# Patient Record
Sex: Male | Born: 1990 | Race: White | Hispanic: No | Marital: Single | Smoking: Never smoker
Health system: Southern US, Community
[De-identification: ages and names within clinical notes are randomized; demographics above are authoritative.]

---

## 2017-05-19 ENCOUNTER — Encounter: Payer: Self-pay | Admitting: *Deleted

## 2017-05-19 ENCOUNTER — Emergency Department
Admission: EM | Admit: 2017-05-19 | Discharge: 2017-05-19 | Disposition: A | Discharge status: Home / Self Care | Payer: No Typology Code available for payment source | Attending: Emergency Medicine | Admitting: Emergency Medicine

## 2017-05-19 ENCOUNTER — Emergency Department: Payer: No Typology Code available for payment source

## 2017-05-19 DIAGNOSIS — S060X1A Concussion with loss of consciousness of 30 minutes or less, initial encounter: Secondary | ICD-10-CM | POA: Diagnosis not present

## 2017-05-19 DIAGNOSIS — S0990XA Unspecified injury of head, initial encounter: Secondary | ICD-10-CM | POA: Diagnosis present

## 2017-05-19 DIAGNOSIS — Y939 Activity, unspecified: Secondary | ICD-10-CM | POA: Diagnosis not present

## 2017-05-19 DIAGNOSIS — Y92488 Other paved roadways as the place of occurrence of the external cause: Secondary | ICD-10-CM | POA: Insufficient documentation

## 2017-05-19 DIAGNOSIS — Y999 Unspecified external cause status: Secondary | ICD-10-CM | POA: Insufficient documentation
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
## 2017-05-19 NOTE — Discharge Instructions (Signed)
Fortunately today you're head CT did not show any broken bones or bleeding. You likely did sustain a concussion which can last up to a full 2-3 weeks. Please refrain from all contact sports until you're cleared by a primary care physician. Return to the emergency department for any concerns.  It was a pleasure to take care of you today, and thank you for coming to our emergency department.  If you have any questions or concerns before leaving please ask the nurse to grab me and I'm more than happy to go through your aftercare instructions again.  If you were prescribed any opioid pain medication today such as Norco, Vicodin, Percocet, morphine, hydrocodone, or oxycodone please make sure you do not drive when you are taking this medication as it can alter your ability to drive safely.  If you have any concerns once you are home that you are not improving or are in fact getting worse before you can make it to your follow-up appointment, please do not hesitate to call 911 and come back for further evaluation.  Merrily BrittleNeil Charrie Mcconnon, MD  No results found for this or any previous visit. Ct Head Wo Contrast  Result Date: 05/19/2017 CLINICAL DATA:  Initial evaluation for acute head trauma, ataxia. EXAM: CT HEAD WITHOUT CONTRAST TECHNIQUE: Contiguous axial images were obtained from the base of the skull through the vertex without intravenous contrast. COMPARISON:  None. FINDINGS: Brain: Cerebral volume within normal limits for patient age. No evidence for acute intracranial hemorrhage. No findings to suggest acute large vessel territory infarct. No mass lesion, midline shift, or mass effect. Right ventricle asymmetrically enlarged as compared to the left without hydrocephalus. This is likely chronic. No periventricular hypodensity to suggest transependymal flow of CSF. No extra-axial fluid collection identified. Vascular: No hyperdense vessel identified. Skull: Scalp soft tissues demonstrate no acute  abnormality.Calvarium intact. Sinuses/Orbits: Globes and orbital soft tissues demonstrate no acute abnormality. Linear density traversing the posterior aspect of the right globe favored to be artifactual. Visualized paranasal sinuses are clear. No mastoid effusion. IMPRESSION: No acute intracranial process. Electronically Signed   By: Rise MuBenjamin  McClintock M.D.   On: 05/19/2017 04:57

## 2019-03-14 IMAGING — CT CT HEAD W/O CM
3 series · 15 of 47 positions shown, 18 images · non-contrast
Comparison: None.

CLINICAL DATA: Initial evaluation for acute head trauma, ataxia.

EXAM:
CT HEAD WITHOUT CONTRAST
TECHNIQUE: Contiguous axial images were obtained from the base of the skull
through the vertex without intravenous contrast.

[Series 3: head wo · axial · 0.45mm/px · z∈[-78,+47]mm · 9 of 31 slices shown, 12 images]
[im 3/31  brain]
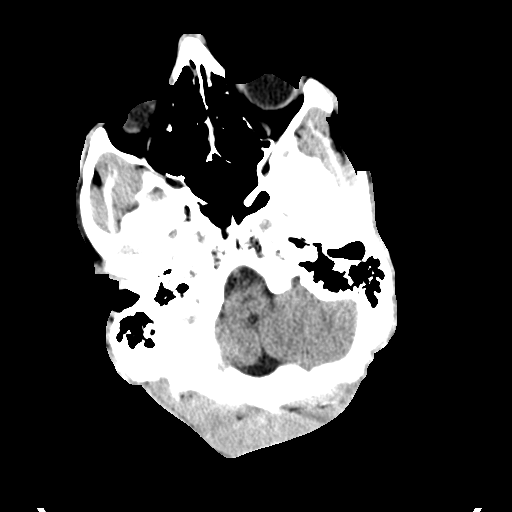
[im 3/31  bone]
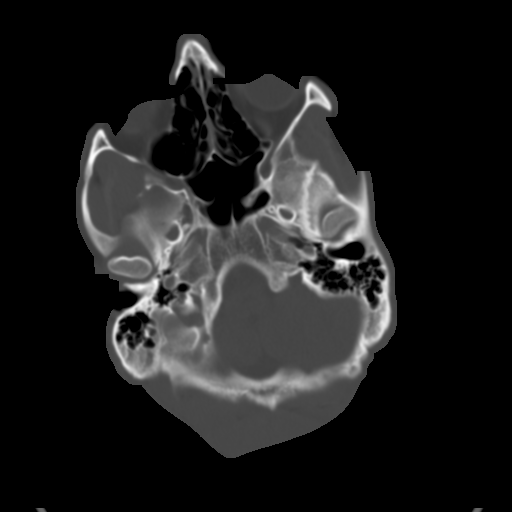
[im 6/31  brain]
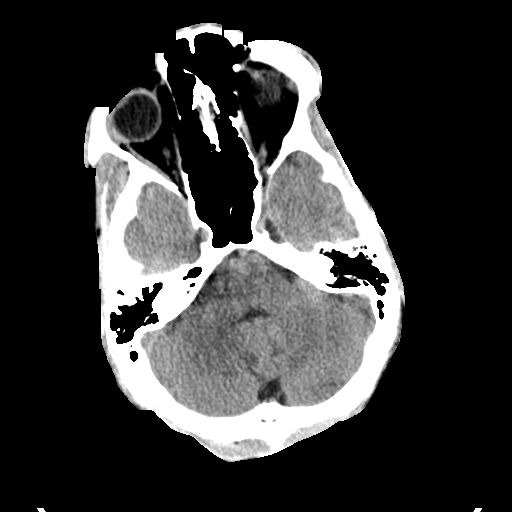
[im 9/31  brain]
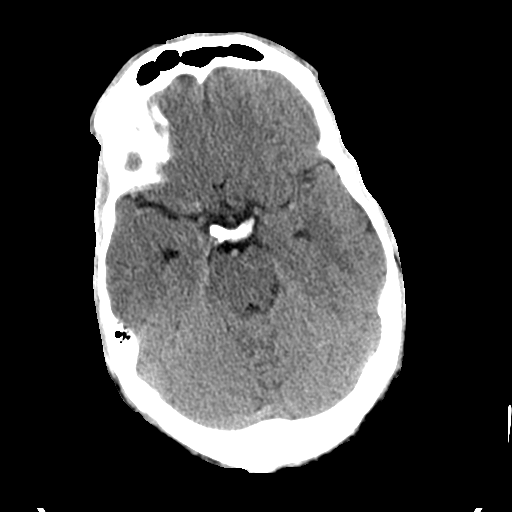
[im 12/31  brain]
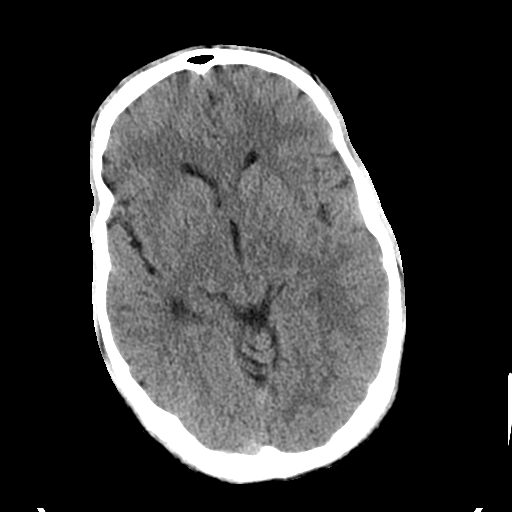
[im 16/31  brain]
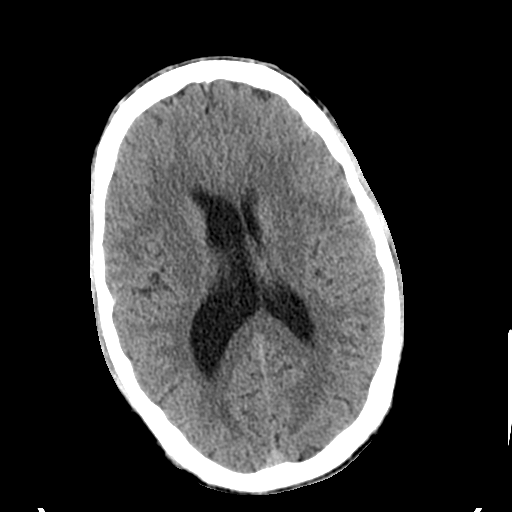
[im 16/31  bone]
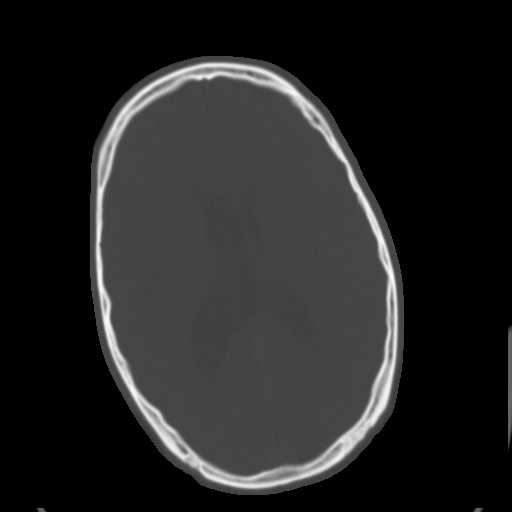
[im 19/31  brain]
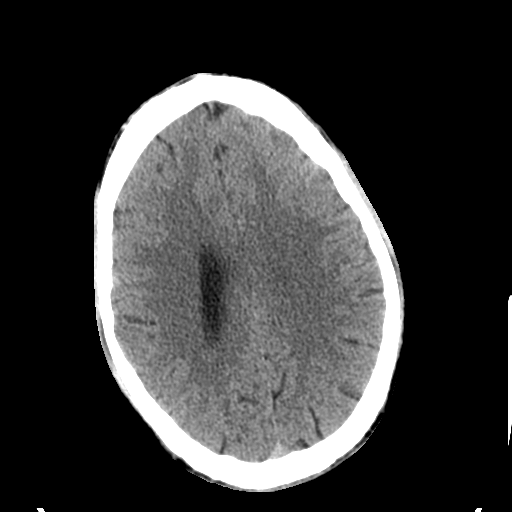
[im 22/31  brain]
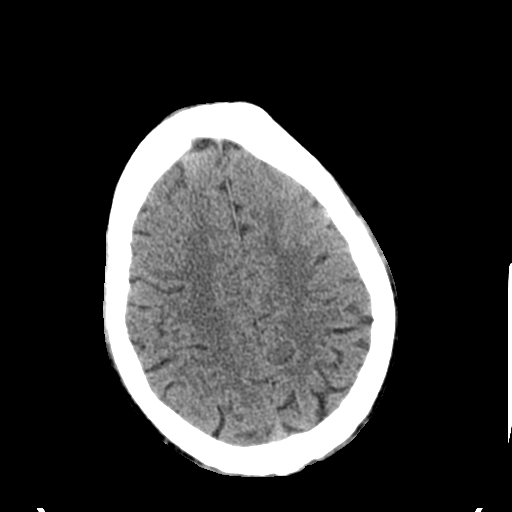
[im 25/31  brain]
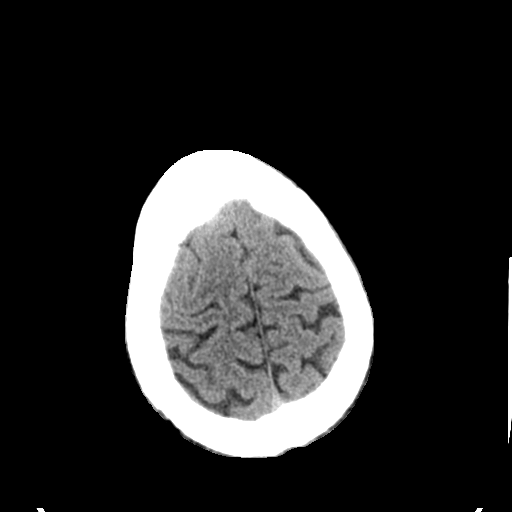
[im 28/31  brain]
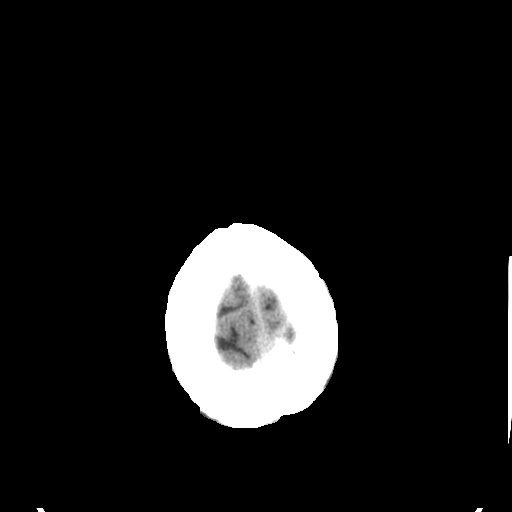
[im 28/31  bone]
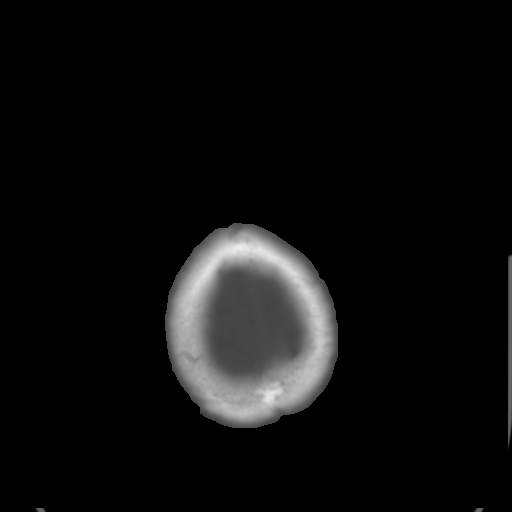

[Series 4: coronal soft tissue · coronal · 0.30mm/px · 3 of 72 slices shown]
[im 24/72  brain]
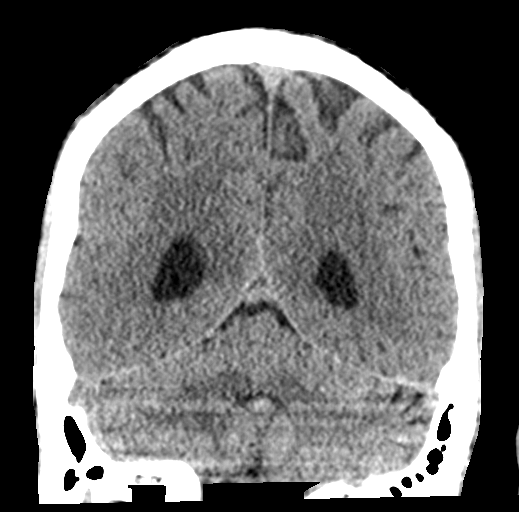
[im 32/72  brain]
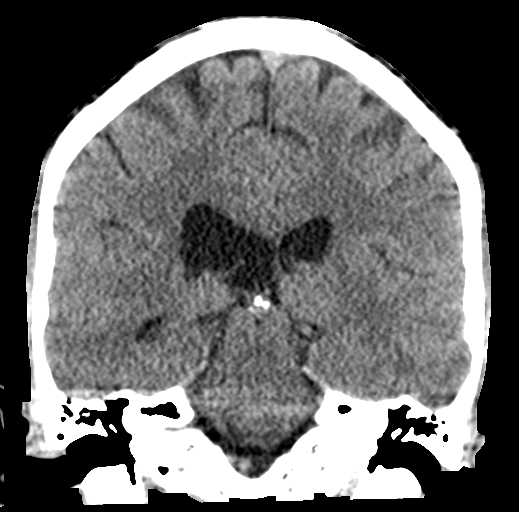
[im 40/72  brain]
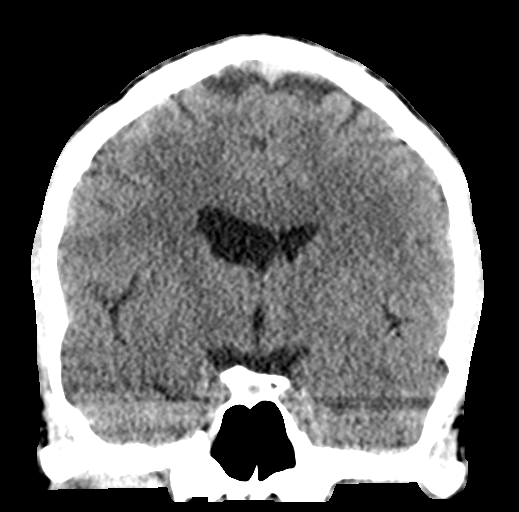

[Series 5: sagittal soft tissue · sagittal · 0.30mm/px · 3 of 52 slices shown]
[im 18/52  brain]
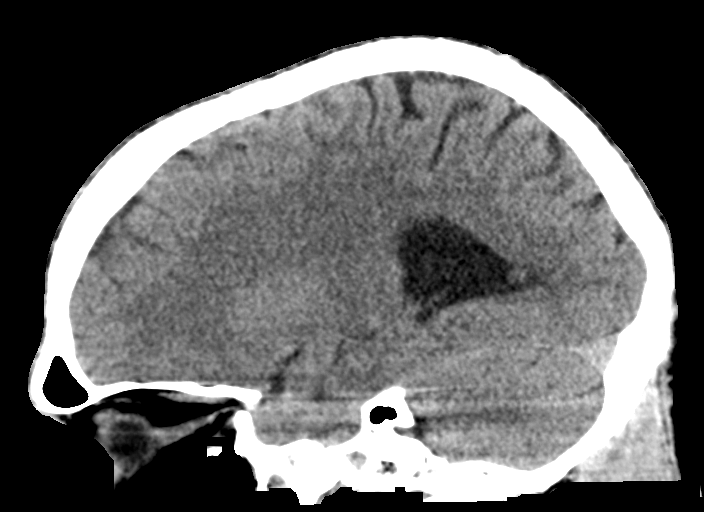
[im 26/52  brain]
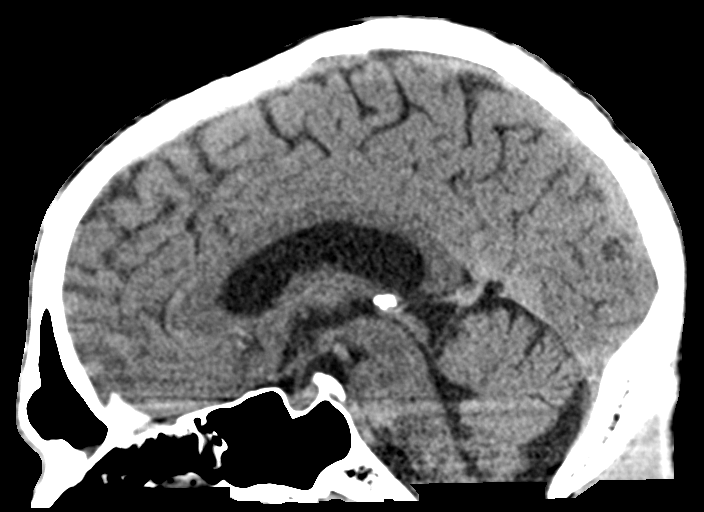
[im 35/52  brain]
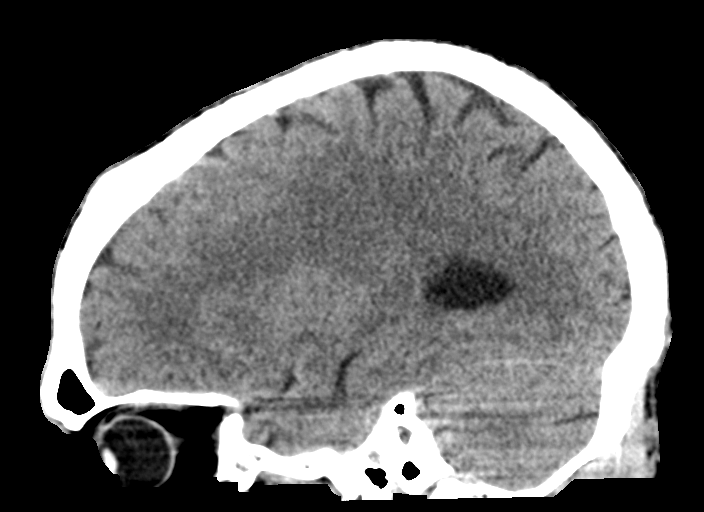

[15 of 47 positions shown; findings below may reference images not displayed]

FINDINGS: Brain: Cerebral volume within normal limits for patient age.

No evidence for acute intracranial hemorrhage. No findings to
suggest acute large vessel territory infarct. No mass lesion,
midline shift, or mass effect. Right ventricle asymmetrically
enlarged as compared to the left without hydrocephalus. This is
likely chronic. No periventricular hypodensity to suggest
transependymal flow of CSF. No extra-axial fluid collection
identified.

Vascular: No hyperdense vessel identified.

Skull: Scalp soft tissues demonstrate no acute abnormality.Calvarium
intact.

Sinuses/Orbits: Globes and orbital soft tissues demonstrate no acute
abnormality. Linear density traversing the posterior aspect of the
right globe favored to be artifactual.

Visualized paranasal sinuses are clear. No mastoid effusion.
IMPRESSION: No acute intracranial process.
